# Patient Record
Sex: Female | Born: 1975 | Race: Black or African American | Hispanic: No | Marital: Single | Smoking: Never smoker
Health system: Southern US, Community
[De-identification: ages and names within clinical notes are randomized; demographics above are authoritative.]

---

## 2019-01-21 ENCOUNTER — Other Ambulatory Visit: Payer: Self-pay

## 2019-01-21 ENCOUNTER — Emergency Department (HOSPITAL_COMMUNITY)
Admission: EM | Admit: 2019-01-21 | Discharge: 2019-01-21 | Disposition: A | Discharge status: Home / Self Care | Payer: Self-pay | Attending: Emergency Medicine | Admitting: Emergency Medicine

## 2019-01-21 ENCOUNTER — Encounter (HOSPITAL_COMMUNITY): Payer: Self-pay

## 2019-01-21 DIAGNOSIS — R197 Diarrhea, unspecified: Secondary | ICD-10-CM | POA: Insufficient documentation

## 2019-01-21 DIAGNOSIS — R109 Unspecified abdominal pain: Secondary | ICD-10-CM | POA: Insufficient documentation

## 2019-01-21 DIAGNOSIS — R112 Nausea with vomiting, unspecified: Secondary | ICD-10-CM | POA: Insufficient documentation

## 2019-01-21 MED ORDER — DIPHENHYDRAMINE HCL 50 MG/ML IJ SOLN
12.5000 mg | Freq: Once | INTRAMUSCULAR | Status: AC
  Administered 2019-01-21: 12.5 mg via INTRAVENOUS
  Filled 2019-01-21: qty 1

## 2019-01-21 MED ORDER — METOCLOPRAMIDE HCL 5 MG/ML IJ SOLN
10.0000 mg | Freq: Once | INTRAMUSCULAR | Status: AC
  Administered 2019-01-21: 10 mg via INTRAVENOUS
  Filled 2019-01-21: qty 2

## 2019-01-21 MED ORDER — METOCLOPRAMIDE HCL 10 MG PO TABS: 10.0000 mg | ORAL_TABLET | Freq: Four times a day (QID) | ORAL | 0 refills | Status: AC

## 2019-01-21 MED ORDER — PROMETHAZINE HCL 25 MG RE SUPP: 25.0000 mg | Freq: Four times a day (QID) | RECTAL | 0 refills | Status: AC | PRN

## 2019-01-21 MED ORDER — SODIUM CHLORIDE 0.9 % IV BOLUS
1000.0000 mL | Freq: Once | INTRAVENOUS | Status: AC
  Administered 2019-01-21: 1000 mL via INTRAVENOUS

## 2019-01-21 NOTE — Discharge Instructions (Signed)
Use Reglan tablets as directed for nausea and vomiting. If not able to take oral medication, use the Phenergan suppositories to control nausea/vomiting. REturn to the ED if you develop any fever, severe pain, have uncontrolled vomiting or new concern.

## 2019-01-21 NOTE — ED Notes (Signed)
Offered ODT zofran, she states that she tried it yesterday without relief.

## 2019-01-21 NOTE — ED Provider Notes (Signed)
[facility]-EMERGENCY DEPT Provider Note   CSN: [id number] Arrival date & time: 01/21/19  0125     History   Chief Complaint Chief Complaint  Patient presents with  . Emesis    HPI Lindsay Salinas is a 43 y.o. female.  Patient to the ED with nausea and vomiting x 4 days. No fever. She had diarrhea on day one but none since. She reports emesis with any attempt at PO solids/liquids. No hematemesis. She has abdominal soreness when she vomits but otherwise does not complain of abdominal pain. No urinary symptoms.  Symptoms started after eating at [facility] and she feels this may be related.   The history is provided by the patient. No language interpreter was used.  Emesis  Associated symptoms: abdominal pain (See HPi.)   Associated symptoms: no chills, no cough, no fever and no myalgias     History reviewed. No pertinent past medical history.  There are no active problems to display for this patient.   OB History   No obstetric history on file.      Home Medications    Prior to Admission medications   Not on File    Family History History reviewed. No pertinent family history.  Social History Social History   Tobacco Use  . Smoking status: Not on file  Substance Use Topics  . Alcohol use: Not on file  . Drug use: Not on file     Allergies   Patient has no known allergies.   Review of Systems Review of Systems  Constitutional: Negative for chills and fever.  Respiratory: Negative.  Negative for cough and shortness of breath.   Cardiovascular: Negative.  Negative for chest pain.  Gastrointestinal: Positive for abdominal pain (See HPi.), nausea and vomiting.  Genitourinary: Negative.   Musculoskeletal: Negative.  Negative for myalgias.  Skin: Negative.   Neurological: Negative.  Negative for syncope and weakness.     Physical Exam Updated Vital Signs BP (!) 150/94 (BP Location: Left Arm)   Pulse (!) 56   Temp 98.7 F (37.1 C)  (Oral)   Resp 16   SpO2 100%   Physical Exam Vitals signs and nursing note reviewed.  Constitutional:      General: She is not in acute distress.    Appearance: She is well-developed.  HENT:     Head: Normocephalic.     Mouth/Throat:     Mouth: Mucous membranes are dry.  Neck:     Musculoskeletal: Normal range of motion and neck supple.  Cardiovascular:     Rate and Rhythm: Normal rate and regular rhythm.     Heart sounds: No murmur.  Pulmonary:     Effort: Pulmonary effort is normal.     Breath sounds: Normal breath sounds. No wheezing, rhonchi or rales.  Abdominal:     General: Bowel sounds are normal. There is no distension.     Palpations: Abdomen is soft.     Tenderness: There is no abdominal tenderness. There is no guarding or rebound.  Musculoskeletal: Normal range of motion.  Skin:    General: Skin is warm and dry.     Findings: No rash.     Comments: Good skin turgor.  Neurological:     Mental Status: She is alert and oriented to person, place, and time.      ED Treatments / Results  Labs (all labs ordered are listed, but only abnormal results are displayed) Labs Reviewed - No data to display  EKG  None  Radiology No results found.  Procedures Procedures (including critical care time)  Medications Ordered in ED Medications - No data to display   Initial Impression / Assessment and Plan / ED Course  I have reviewed the triage vital signs and the nursing notes.  Pertinent labs & imaging results that were available during my care of the patient were reviewed by me and considered in my medical decision making (see chart for details).     Patient to ED with 4 days of nausea and vomiting, unable to tolerate any PO. No fever, significant pain or ongoing diarrhea.   She is well appearing, nontoxic. Abdominal exam is benign. She reports nausea.   IV fluids provided with Reglan/Benadryl. Will try PO challenge after.   Patient has received IV fluids.  She feels better. She is tolerating small amounts of PO fluids without further vomiting. She is felt appropriate for discharge home. Return precautions discussed.   Final Clinical Impressions(s) / ED Diagnoses   Final diagnoses:  None   1. Emesis   ED Discharge Orders    None       Elpidio AnisUpstill, Eleuterio Dollar, PA-C 01/21/19 0546    Sabas SousBero, Michael M, MD 01/21/19 [phone]

## 2019-01-21 NOTE — ED Triage Notes (Signed)
Pt reports that she ate a sandwich from someone on Monday morning and had vomiting and diarrhea that day. She reports that she has been experiencing N/V since and is unable to keep anything down.

## 2019-06-06 ENCOUNTER — Other Ambulatory Visit: Payer: Self-pay | Admitting: Family Medicine

## 2019-06-06 DIAGNOSIS — N6310 Unspecified lump in the right breast, unspecified quadrant: Secondary | ICD-10-CM

## 2019-06-07 ENCOUNTER — Other Ambulatory Visit: Payer: Self-pay | Admitting: Family Medicine

## 2019-06-07 ENCOUNTER — Other Ambulatory Visit: Payer: Self-pay

## 2019-06-07 ENCOUNTER — Ambulatory Visit
Admission: RE | Admit: 2019-06-07 | Discharge: 2019-06-07 | Disposition: A | Discharge status: Home / Self Care | Payer: 59 | Source: Ambulatory Visit | Attending: Family Medicine | Admitting: Family Medicine

## 2019-06-07 DIAGNOSIS — N6310 Unspecified lump in the right breast, unspecified quadrant: Secondary | ICD-10-CM

## 2019-06-20 ENCOUNTER — Inpatient Hospital Stay: Admission: RE | Admit: 2019-06-20 | Payer: 59 | Source: Ambulatory Visit

## 2021-01-09 IMAGING — MG DIGITAL DIAGNOSTIC BILATERAL MAMMOGRAM WITH TOMO AND CAD
6 of 10 series · 6 of 30 positions shown · non-contrast
Comparison: None

CLINICAL DATA: This patient has had intermittent right breast
infections in the retroareolar region which present with
retroareolar swelling and periareolar erythema. She has had
intermittent purulence drainage. Currently, she has retroareolar
masslike fullness with surrounding erythema and has recently been
able to express approximately 1 tablespoon of purulence drainage.

EXAM:
DIGITAL DIAGNOSTIC BILATERAL MAMMOGRAM WITH CAD AND TOMO
ULTRASOUND RIGHT BREAST

[L CC synth-2D]
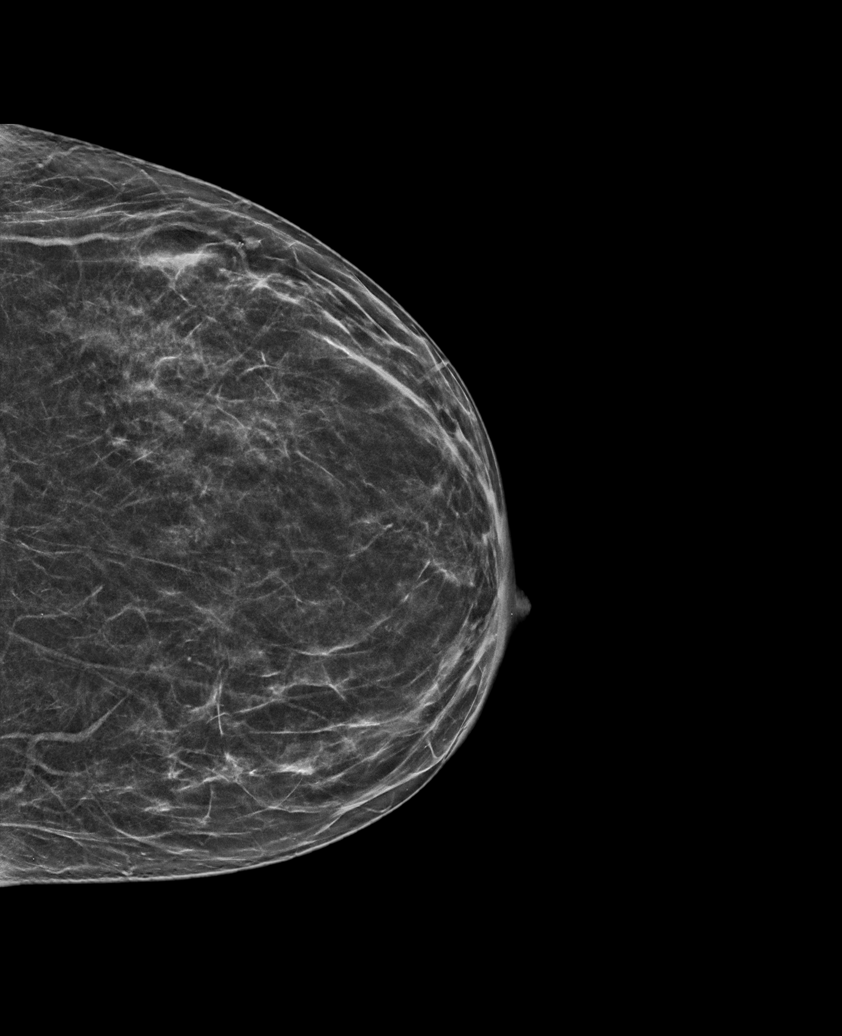

[R TAN synth-2D]
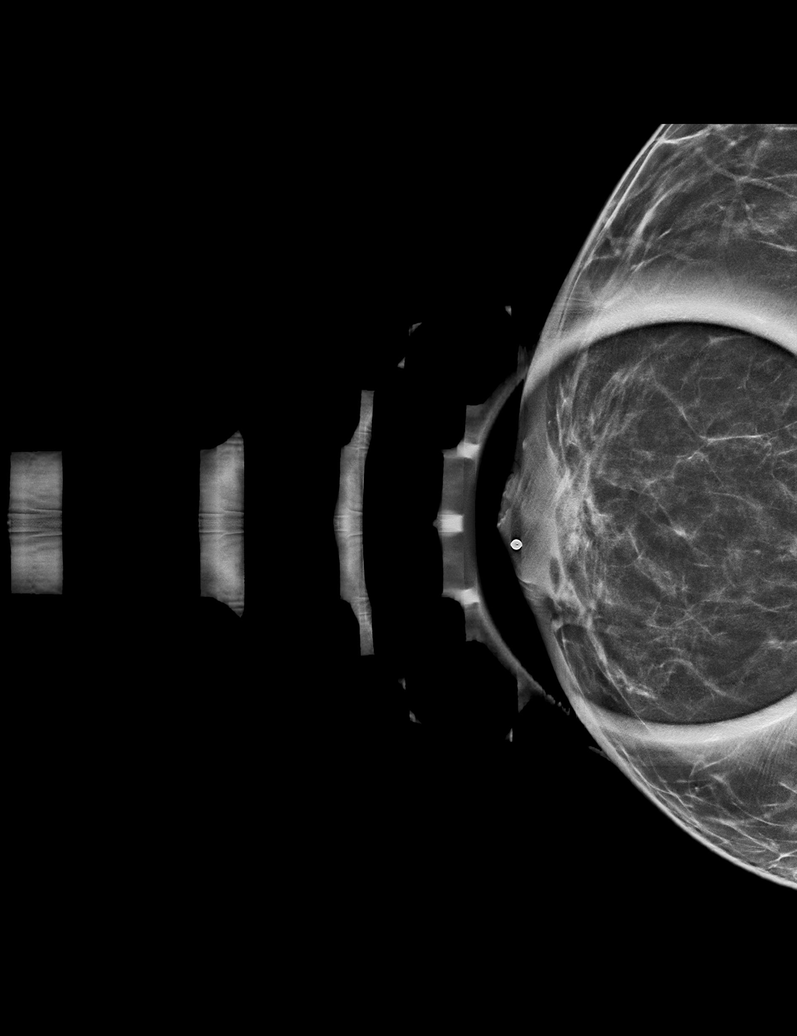

[L MLO synth-2D]
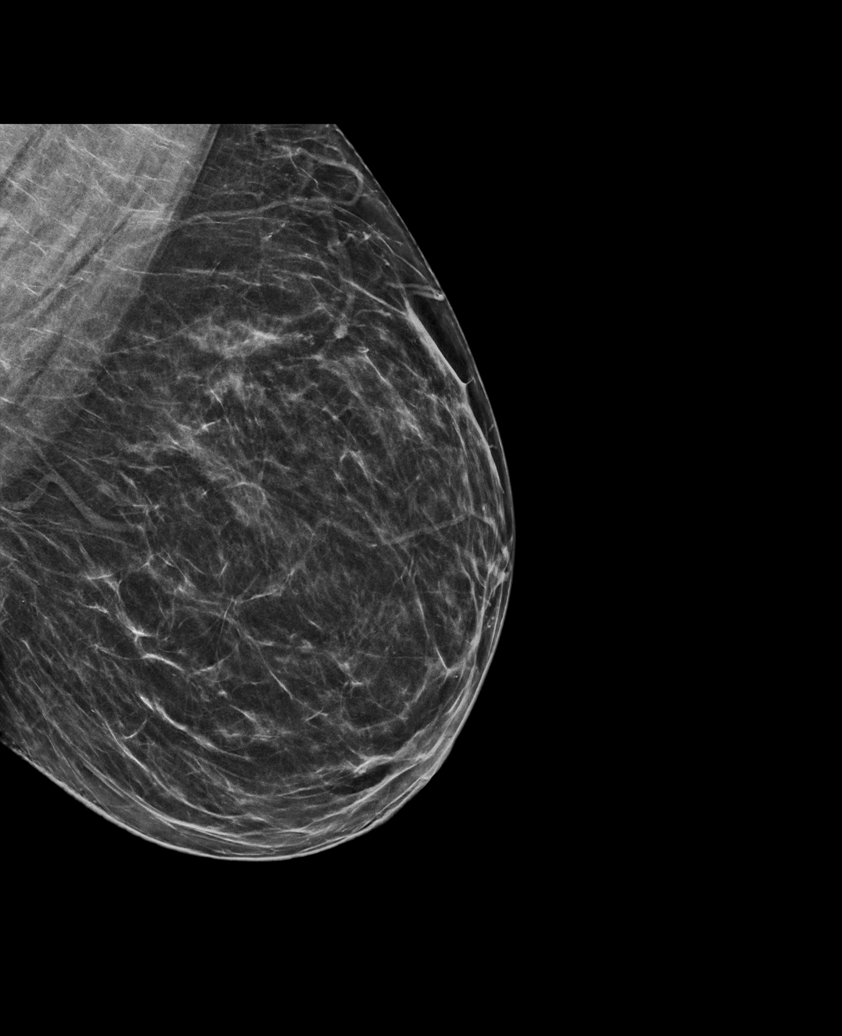

[R MLO synth-2D]
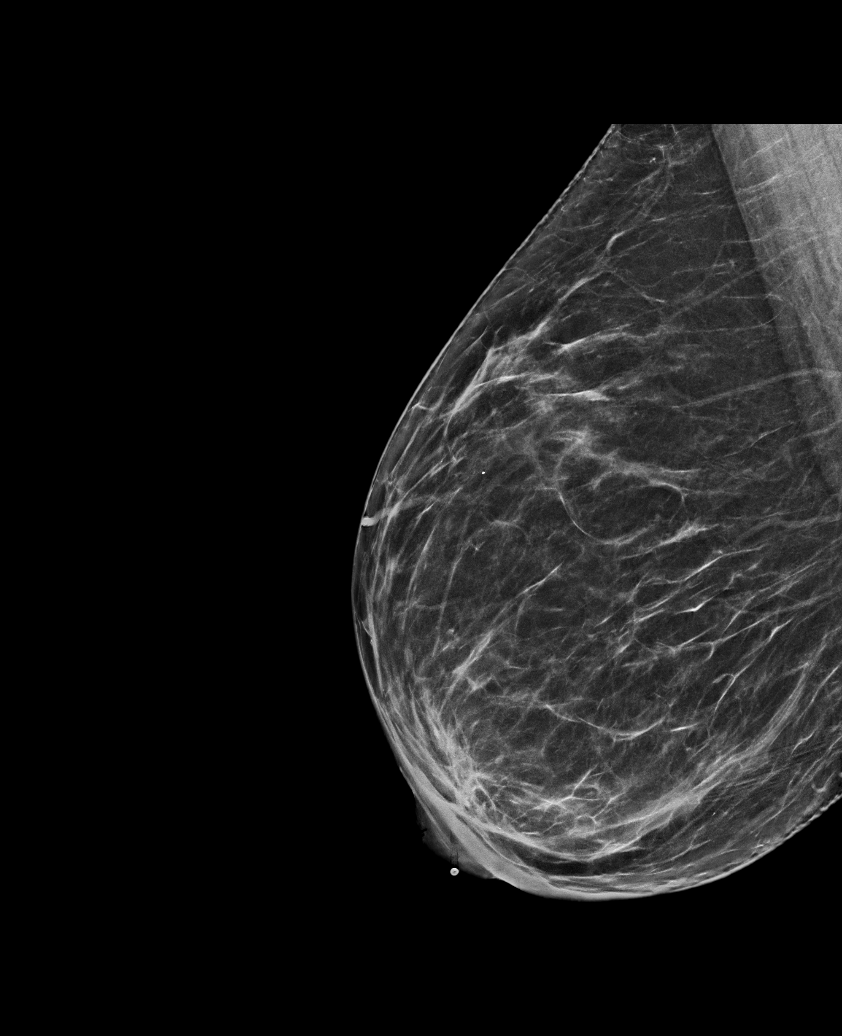

[R CC synth-2D]
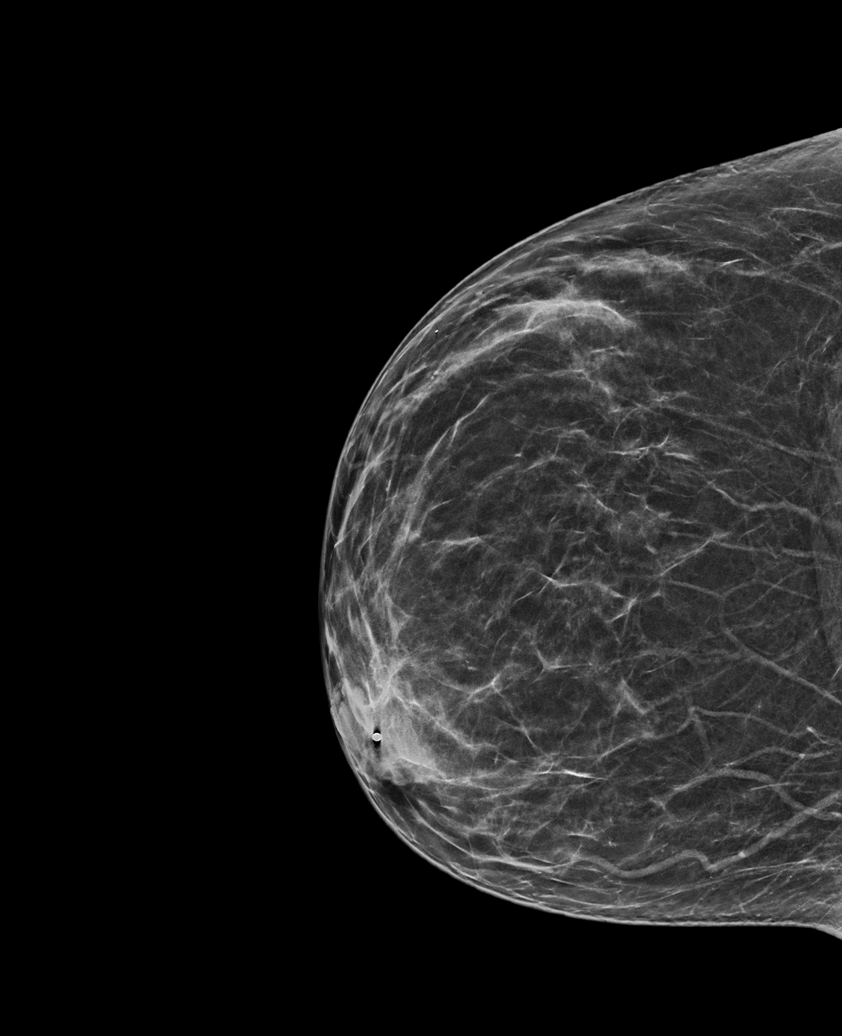

[R MLO tomo · tomo slice 37/73.0]
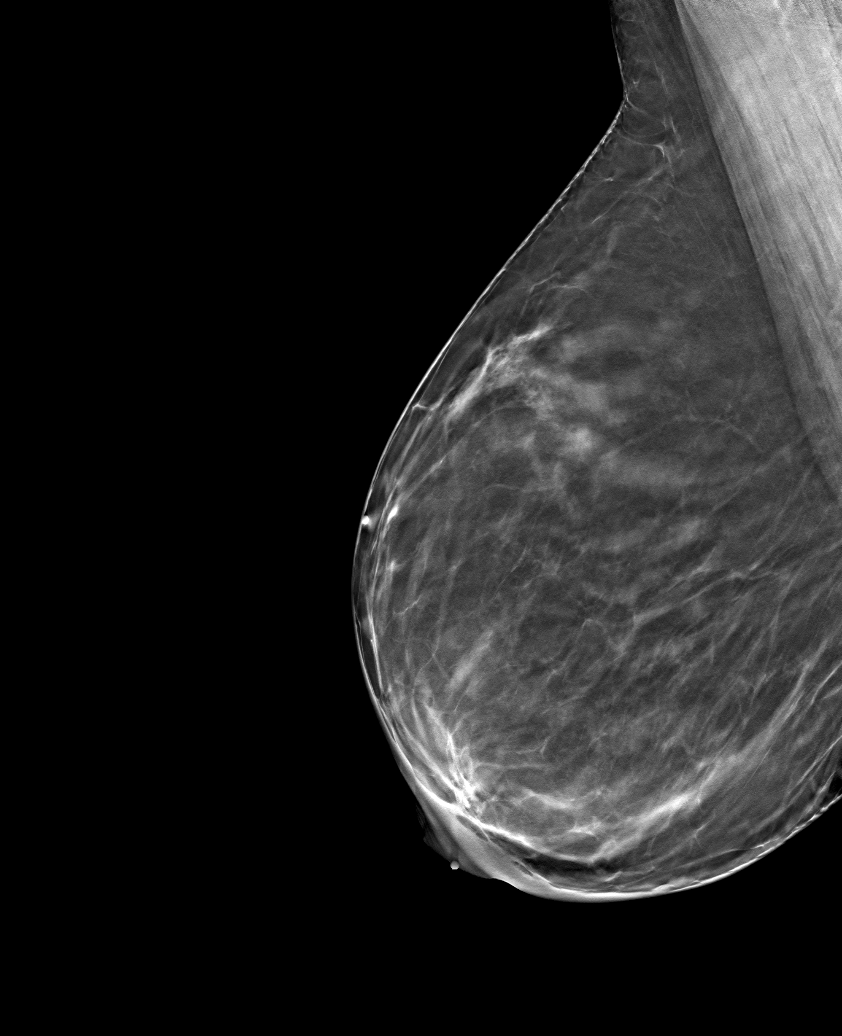

[6 of 30 positions shown; findings below may reference images not displayed]

ACR Breast Density Category b: There are scattered areas of
fibroglandular density.
FINDINGS: There is increased density in the retroareolar region of the right
breast with overlying skin thickening, but without a defined mass.

There are no discrete masses, other areas of asymmetric opacity,
areas of architectural distortion or suspicious calcifications.

Mammographic images were processed with CAD.

On physical exam, there is erythema surrounding the right nipple
with masslike firmness in the retroareolar region of the right
breast.

Targeted ultrasound is performed, showing a heterogeneous, mostly
hypoechoic masslike abnormality in the immediate retroareolar region
of the right breast, with increased blood flow around its margins.
There is a tract of complicated fluid that extends from this to the
nipple, measuring approximately 3 cm in length. There is a small
focus of heterogeneous fluid with in this area that measures 6-7 mm
in greatest dimension. The overall size of the abnormality is
approximately 4.2 x 1.4 x 4.0 cm.
IMPRESSION: 1. Retroareolar right breast infection minimal discrete fluid,
primarily inflamed/infected tissue. Given the location of the
patient's history, this is suspicious for MRSA infection. At this
time, there does not appear to be significant fluid for aspiration.

RECOMMENDATION:
1. Patient we placed on a 14 day course of Bactrim, double-strength,
1 p.o. b.i.d. She is scheduled for repeat ultrasound assessment in
14 days to evaluate for improvement/resolution, and was instructed
to return earlier if symptoms worsen.

I have discussed the findings and recommendations with the patient.
Results were also provided in writing at the conclusion of the
visit. If applicable, a reminder letter will be sent to the patient
regarding the next appointment.

BI-RADS CATEGORY  2: Benign.

## 2021-11-20 ENCOUNTER — Encounter (HOSPITAL_COMMUNITY): Payer: Self-pay | Admitting: Oncology

## 2021-11-20 ENCOUNTER — Emergency Department (HOSPITAL_COMMUNITY)
Admission: EM | Admit: 2021-11-20 | Discharge: 2021-11-20 | Disposition: A | Discharge status: Home / Self Care | Payer: 59 | Attending: Emergency Medicine | Admitting: Emergency Medicine

## 2021-11-20 ENCOUNTER — Emergency Department (HOSPITAL_COMMUNITY): Payer: 59

## 2021-11-20 ENCOUNTER — Other Ambulatory Visit: Payer: Self-pay

## 2021-11-20 DIAGNOSIS — T148XXA Other injury of unspecified body region, initial encounter: Secondary | ICD-10-CM

## 2021-11-20 DIAGNOSIS — S92411A Displaced fracture of proximal phalanx of right great toe, initial encounter for closed fracture: Secondary | ICD-10-CM | POA: Insufficient documentation

## 2021-11-20 DIAGNOSIS — S99921A Unspecified injury of right foot, initial encounter: Secondary | ICD-10-CM | POA: Diagnosis present

## 2021-11-20 DIAGNOSIS — M79674 Pain in right toe(s): Secondary | ICD-10-CM | POA: Insufficient documentation

## 2021-11-20 DIAGNOSIS — Y92410 Unspecified street and highway as the place of occurrence of the external cause: Secondary | ICD-10-CM | POA: Insufficient documentation

## 2021-11-20 MED ORDER — HYDROCODONE-ACETAMINOPHEN 5-325 MG PO TABS: 1.0000 | ORAL_TABLET | Freq: Four times a day (QID) | ORAL | 0 refills | Status: AC | PRN

## 2021-11-20 MED ORDER — ACETAMINOPHEN 325 MG PO TABS
650.0000 mg | ORAL_TABLET | Freq: Once | ORAL | Status: AC
  Administered 2021-11-20: 650 mg via ORAL
  Filled 2021-11-20: qty 2
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
## 2021-11-20 NOTE — Discharge Instructions (Addendum)
Your x-ray showed avulsion fracture of your right big toe.  It is important that you call the orthopedist and inform them that you were seen in the ED to set up a follow-up appointment.  Wear the post op shoe during the day, you may weight bear as you can tolerate. You may remove the post op shoe at night.  You may continue with buddy taping the two toes as needed for pain. You are prescribed Norco take as prescribed.  Do not operate any heavy machinery or drive while taking this medication. You may apply ice to the affected area for up to 15 minutes at a time.  Ensure to place a barrier between your skin and the ice.  Return to the Emergency Department if you are experiencing increasing/worsening swelling, bruising, pain, or worsening symptoms.

## 2021-11-20 NOTE — ED Notes (Signed)
Pt in room speaking to registration before leaving.

## 2021-11-20 NOTE — ED Notes (Signed)
Ortho tech bedside 
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
## 2021-11-20 NOTE — ED Notes (Signed)
An After Visit Summary was printed and given to the patient. Discharge instructions given and no further questions at this time.  

## 2022-05-09 ENCOUNTER — Telehealth: Payer: Self-pay | Admitting: Urgent Care

## 2022-05-09 ENCOUNTER — Telehealth: Payer: Self-pay

## 2022-05-09 ENCOUNTER — Ambulatory Visit
Admission: EM | Admit: 2022-05-09 | Discharge: 2022-05-09 | Disposition: A | Discharge status: Home / Self Care | Payer: 59

## 2022-05-09 DIAGNOSIS — N611 Abscess of the breast and nipple: Secondary | ICD-10-CM | POA: Diagnosis not present

## 2022-05-09 DIAGNOSIS — N644 Mastodynia: Secondary | ICD-10-CM | POA: Diagnosis not present

## 2022-05-09 MED ORDER — MELOXICAM 15 MG PO TABS: 15.0000 mg | ORAL_TABLET | Freq: Every day | ORAL | 1 refills | Status: AC

## 2022-05-09 MED ORDER — FLUCONAZOLE 150 MG PO TABS: 150.0000 mg | ORAL_TABLET | ORAL | 0 refills | Status: AC

## 2022-05-09 MED ORDER — NAPROXEN 500 MG PO TABS: 500.0000 mg | ORAL_TABLET | Freq: Two times a day (BID) | ORAL | 0 refills | Status: DC

## 2022-05-09 MED ORDER — CLINDAMYCIN HCL 300 MG PO CAPS: 300.0000 mg | ORAL_CAPSULE | Freq: Three times a day (TID) | ORAL | 0 refills | Status: AC

## 2022-05-09 NOTE — Discharge Instructions (Signed)
Please change your dressing 3-5 times daily. Do not apply any ointments or creams. Each time you change your dressing, make sure that you are pressing on the wound to get pus to come out.  Try your best to have a family member help you clean gently around the perimeter of the wound with gentle soap and warm water. Pat your wound dry and let it air out if possible to make sure it is dry before reapplying another dressing.   Start clindamycin for the infection. Use naproxen for the pain.  Follow-up with [facility] to request a consultation on having a procedure to get your abscess treated.

## 2022-05-09 NOTE — Telephone Encounter (Signed)
Patient called urgent care requesting another medication for pain management. Provider sent in a medication and the patient has been made aware and educated on it.

## 2022-05-09 NOTE — Telephone Encounter (Signed)
Will send a script for meloxicam to her pharmacy to help with her pain.

## 2022-05-09 NOTE — ED Triage Notes (Signed)
Pt c/o boil/ abscess that drains. She states there is drainage above her right nipple but a lump is under the nipple.  There pin hole opening above the top of the patients right nipple with no draining at this time.

## 2022-05-09 NOTE — ED Provider Notes (Addendum)
[place]   MRN: [id number] DOB: 1976-11-10  Subjective:   Lindsay Salinas is a 46 y.o. female presenting for several week history of recurrent drainage just above the nipple of the right breast.  Patient reports that she has previously had an abscess of the area.  This happened about 3 years ago.  States that she had an allergic reaction to the Bactrim that she was placed on and therefore stopped taking it and never followed up.  She is also had a reaction to doxycycline which she was put on for acne.  States that she can remember exactly what happened with it.  Denies fever, nausea, vomiting, body aches, chills.  No current facility-administered medications for this encounter.  Current Outpatient Medications:    cetirizine (ZYRTEC) 10 MG chewable tablet, Chew 10 mg by mouth daily., Disp: , Rfl:    cetirizine (ZYRTEC) 10 MG tablet, Take 10 mg by mouth daily., Disp: , Rfl:    HYDROcodone-acetaminophen (NORCO/VICODIN) 5-325 MG tablet, Take 1 tablet by mouth every 6 (six) hours as needed., Disp: 10 tablet, Rfl: 0   metoCLOPramide (REGLAN) 10 MG tablet, Take 1 tablet (10 mg total) by mouth every 6 (six) hours., Disp: 15 tablet, Rfl: 0   promethazine (PHENERGAN) 25 MG suppository, Place 1 suppository (25 mg total) rectally every 6 (six) hours as needed for nausea or vomiting., Disp: 6 each, Rfl: 0   Allergies  Allergen Reactions   Penicillins Anaphylaxis    Patient states she has swelling and itching to her lips.    Doxycycline     Patient states she felt tingling in her lips while taking this medication.    History reviewed. No pertinent past medical history.   History reviewed. No pertinent surgical history.  Family History  Adopted: Yes    Social History   Tobacco Use   Smoking status: Never    Passive exposure: Never   Smokeless tobacco: Never  Vaping Use   Vaping Use: Every day   Substances: Nicotine, Flavoring  Substance Use Topics    Alcohol use: Not Currently   Drug use: Not Currently    ROS   Objective:   Vitals: BP 126/69 (BP Location: Left Arm)   Pulse 65   Temp 98.6 F (37 C) (Oral)   Resp 18   LMP 04/09/2022   SpO2 98%   Physical Exam Constitutional:      General: She is not in acute distress.    Appearance: Normal appearance. She is well-developed. She is not ill-appearing, toxic-appearing or diaphoretic.  HENT:     Head: Normocephalic and atraumatic.     Nose: Nose normal.     Mouth/Throat:     Mouth: Mucous membranes are moist.  Eyes:     General: No scleral icterus.       Right eye: No discharge.        Left eye: No discharge.     Extraocular Movements: Extraocular movements intact.  Cardiovascular:     Rate and Rhythm: Normal rate and regular rhythm.     Heart sounds: Normal heart sounds. No murmur heard.   No friction rub. No gallop.  Pulmonary:     Effort: Pulmonary effort is normal. No respiratory distress.     Breath sounds: No stridor. No wheezing, rhonchi or rales.  Chest:     Chest wall: No tenderness.  Breasts:    Right: Swelling, mass and tenderness present. No bleeding, inverted nipple or nipple discharge.  Comments: RN Tiffany assisted as chaperone. Skin:    General: Skin is warm and dry.  Neurological:     General: No focal deficit present.     Mental Status: She is alert and oriented to person, place, and time.  Psychiatric:        Mood and Affect: Mood normal.        Behavior: Behavior normal.    IMPRESSION: 1. Retroareolar right breast infection minimal discrete fluid, primarily inflamed/infected tissue. Given the location of the patient's history, this is suspicious for MRSA infection. At this time, there does not appear to be significant fluid for aspiration.   RECOMMENDATION: 1. Patient we placed on a 14 day course of Bactrim, double-strength, 1 p.o. b.i.d. She is scheduled for repeat ultrasound assessment in 14 days to evaluate for  improvement/resolution, and was instructed to return earlier if symptoms worsen.   I have discussed the findings and recommendations with the patient. Results were also provided in writing at the conclusion of the visit. If applicable, a reminder letter will be sent to the patient regarding the next appointment.   BI-RADS CATEGORY  2: Benign.     Electronically Signed   By: Lajean Manes M.D.   On: 06/07/2019 15:09   Aspiration procedure: Right breast Verbal consent obtained.  Area cleansed with Betadine and alcohol.  Topical numbing performed thereafter local anesthesia with 2% plus epinephrine ~2cc.  An 18-gauge needle was inserted at the 3 o'clock position of the nipple, 1/2cc of purulence aspirated.  Wound cleansed and dressed.  Assessment and Plan :   PDMP not reviewed this encounter.  1. Breast abscess   2. Breast pain, right    Unfortunately we do not have the ability to perform anaerobic cultures.  Patient has a recurrent abscess and would benefit from procedural incision and drainage.  Therefore I recommended that she contact [facility] for this.  In the meantime start clindamycin and use fluconazole for yeast infections associated with antibiotic use.  Counseled on wound care.  Apply warm compresses at home. Counseled patient on potential for adverse effects with medications prescribed/recommended today, ER and return-to-clinic precautions discussed, patient verbalized understanding.    Jaynee Eagles, PA-C 05/09/22 1124
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
## 2024-02-13 ENCOUNTER — Emergency Department (HOSPITAL_COMMUNITY)
Admission: EM | Admit: 2024-02-13 | Discharge: 2024-02-13 | Disposition: A | Discharge status: Home / Self Care | Attending: Emergency Medicine | Admitting: Emergency Medicine

## 2024-02-13 DIAGNOSIS — B349 Viral infection, unspecified: Secondary | ICD-10-CM | POA: Insufficient documentation

## 2024-02-13 DIAGNOSIS — R519 Headache, unspecified: Secondary | ICD-10-CM | POA: Diagnosis not present

## 2024-02-13 DIAGNOSIS — R112 Nausea with vomiting, unspecified: Secondary | ICD-10-CM

## 2024-02-13 DIAGNOSIS — R197 Diarrhea, unspecified: Secondary | ICD-10-CM | POA: Insufficient documentation

## 2024-02-13 LAB — COMPREHENSIVE METABOLIC PANEL
ALT: 16 U/L (ref 0–44)
AST: 16 U/L (ref 15–41)
Albumin: 4.2 g/dL (ref 3.5–5.0)
Alkaline Phosphatase: 70 U/L (ref 38–126)
Anion gap: 12 (ref 5–15)
BUN: 12 mg/dL (ref 6–20)
CO2: 22 mmol/L (ref 22–32)
Calcium: 9.3 mg/dL (ref 8.9–10.3)
Chloride: 104 mmol/L (ref 98–111)
Creatinine, Ser: 0.53 mg/dL (ref 0.44–1.00)
GFR, Estimated: >60 mL/min (ref >60)
Glucose, Bld: 147 mg/dL — ABNORMAL HIGH (ref 70–99)
Potassium: 3.9 mmol/L (ref 3.5–5.1)
Sodium: 138 mmol/L (ref 135–145)
Total Bilirubin: 0.7 mg/dL (ref 0.0–1.2)
Total Protein: 7.8 g/dL (ref 6.5–8.1)

## 2024-02-13 LAB — URINALYSIS, ROUTINE W REFLEX MICROSCOPIC
Bilirubin Urine: NEGATIVE
Glucose, UA: NEGATIVE mg/dL
Hgb urine dipstick: NEGATIVE
Ketones, ur: NEGATIVE mg/dL
Leukocytes,Ua: NEGATIVE
Nitrite: NEGATIVE
Protein, ur: NEGATIVE mg/dL
Specific Gravity, Urine: 1.012 (ref 1.005–1.030)
pH: 8 (ref 5.0–8.0)

## 2024-02-13 LAB — CBC
HCT: 39.9 % (ref 36.0–46.0)
Hemoglobin: 12.7 g/dL (ref 12.0–15.0)
MCH: 27.7 pg (ref 26.0–34.0)
MCHC: 31.8 g/dL (ref 30.0–36.0)
MCV: 86.9 fL (ref 80.0–100.0)
Platelets: 403 10*3/uL — ABNORMAL HIGH (ref 150–400)
RBC: 4.59 MIL/uL (ref 3.87–5.11)
RDW: 14.7 % (ref 11.5–15.5)
WBC: 7.9 10*3/uL (ref 4.0–10.5)
nRBC: 0 % (ref 0.0–0.2)

## 2024-02-13 LAB — HCG, SERUM, QUALITATIVE: Preg, Serum: NEGATIVE

## 2024-02-13 LAB — LIPASE, BLOOD: Lipase: 26 U/L (ref 11–51)

## 2024-02-13 MED ORDER — ONDANSETRON 8 MG PO TBDP: 8.0000 mg | ORAL_TABLET | Freq: Three times a day (TID) | ORAL | 0 refills | Status: AC | PRN

## 2024-02-13 MED ORDER — DIPHENHYDRAMINE HCL 50 MG/ML IJ SOLN
25.0000 mg | Freq: Once | INTRAMUSCULAR | Status: AC
  Administered 2024-02-13: 25 mg via INTRAVENOUS
  Filled 2024-02-13: qty 1

## 2024-02-13 MED ORDER — IBUPROFEN 600 MG PO TABS: 600.0000 mg | ORAL_TABLET | Freq: Four times a day (QID) | ORAL | 0 refills | Status: AC | PRN

## 2024-02-13 MED ORDER — ONDANSETRON 4 MG PO TBDP
4.0000 mg | ORAL_TABLET | Freq: Once | ORAL | Status: AC | PRN
  Administered 2024-02-13: 4 mg via ORAL
  Filled 2024-02-13: qty 1

## 2024-02-13 MED ORDER — PROCHLORPERAZINE EDISYLATE 10 MG/2ML IJ SOLN
10.0000 mg | Freq: Once | INTRAMUSCULAR | Status: AC
  Administered 2024-02-13: 10 mg via INTRAVENOUS
  Filled 2024-02-13: qty 2

## 2024-02-13 MED ORDER — LACTATED RINGERS IV BOLUS
500.0000 mL | Freq: Once | INTRAVENOUS | Status: AC
  Administered 2024-02-13: 500 mL via INTRAVENOUS

## 2024-02-13 MED ORDER — ONDANSETRON HCL 4 MG/2ML IJ SOLN: 4.0000 mg | Freq: Once | INTRAMUSCULAR | Status: DC

## 2024-02-13 NOTE — ED Provider Notes (Signed)
 [place] EMERGENCY DEPARTMENT AT [facility] Provider Note   CSN: [id number] Arrival date & time: 02/13/24  4098     History  Chief Complaint  Patient presents with   Emesis    Lindsay Salinas is a 48 y.o. female.  HPI    48 year old female with no significant past medical history comes in with chief complaint of vomiting, diarrhea.  Patient states that she has been ill for the last several days.  She recently was started on azithromycin and Tessalon by telehealth doctor.  Last night however she started having some nausea, vomiting and loose bowel movements.  She had emesis more than 10 times, now bilious.  Patient has had about 2-3 loose bowel movements.  She has some cramping abdominal pain, that gets better after vomiting or diarrhea.  She has no history of abdominal surgeries.  Review of system is negative for any fevers, UTI-like symptoms, vaginal discharge.  Patient does not think she is pregnant.  Home Medications Prior to Admission medications   Medication Sig Start Date End Date Taking? Authorizing Provider  ibuprofen (ADVIL) 600 MG tablet Take 1 tablet (600 mg total) by mouth every 6 (six) hours as needed for headache. 02/13/24  Yes Antonyo Hinderer, MD  ondansetron (ZOFRAN-ODT) 8 MG disintegrating tablet Take 1 tablet (8 mg total) by mouth every 8 (eight) hours as needed for nausea. 02/13/24  Yes Derwood Kaplan, MD  cetirizine (ZYRTEC) 10 MG chewable tablet Chew 10 mg by mouth daily.    [provider]  cetirizine (ZYRTEC) 10 MG tablet Take 10 mg by mouth daily.    [provider]  fluconazole (DIFLUCAN) 150 MG tablet Take 1 tablet (150 mg total) by mouth once a week. 05/09/22   Wallis Bamberg, PA-C  HYDROcodone-acetaminophen (NORCO/VICODIN) 5-325 MG tablet Take 1 tablet by mouth every 6 (six) hours as needed. 11/20/21   Blue, Soijett A, PA-C  meloxicam (MOBIC) 15 MG tablet Take 1 tablet (15 mg total) by mouth daily. 05/09/22   Wallis Bamberg, PA-C   metoCLOPramide (REGLAN) 10 MG tablet Take 1 tablet (10 mg total) by mouth every 6 (six) hours. 01/21/19   Elpidio Anis, PA-C  promethazine (PHENERGAN) 25 MG suppository Place 1 suppository (25 mg total) rectally every 6 (six) hours as needed for nausea or vomiting. 01/21/19   Elpidio Anis, PA-C      Allergies    Penicillins and Bactrim [sulfamethoxazole-trimethoprim]    Review of Systems   Review of Systems  All other systems reviewed and are negative.   Physical Exam Updated Vital Signs BP (!) 140/69   Pulse (!) 58   Temp 97.8 F (36.6 C) (Oral)   Resp 18   Ht 5\' 8"  (1.727 m)   Wt 72.6 kg   SpO2 100%   BMI 24.33 kg/m  Physical Exam Vitals and nursing note reviewed.  Constitutional:      Appearance: She is well-developed.  HENT:     Head: Atraumatic.  Cardiovascular:     Rate and Rhythm: Normal rate.  Pulmonary:     Effort: Pulmonary effort is normal.  Abdominal:     Palpations: Abdomen is soft.     Tenderness: There is no abdominal tenderness.  Musculoskeletal:     Cervical back: Normal range of motion and neck supple.  Skin:    General: Skin is warm and dry.  Neurological:     Mental Status: She is alert and oriented to person, place, and time.  ED Results / Procedures / Treatments   Labs (all labs ordered are listed, but only abnormal results are displayed) Labs Reviewed  COMPREHENSIVE METABOLIC PANEL - Abnormal; Notable for the following components:      Result Value   Glucose, Bld 147 (*)    All other components within normal limits  CBC - Abnormal; Notable for the following components:   Platelets 403 (*)    All other components within normal limits  URINALYSIS, ROUTINE W REFLEX MICROSCOPIC - Abnormal; Notable for the following components:   Color, Urine STRAW (*)    All other components within normal limits  LIPASE, BLOOD  HCG, SERUM, QUALITATIVE    EKG None  Radiology No results found.  Procedures Procedures    Medications Ordered  in ED Medications  ondansetron (ZOFRAN-ODT) disintegrating tablet 4 mg (4 mg Oral Given 02/13/24 1014)  lactated ringers bolus 500 mL (0 mLs Intravenous Stopped 02/13/24 1724)  prochlorperazine (COMPAZINE) injection 10 mg (10 mg Intravenous Given 02/13/24 1608)  diphenhydrAMINE (BENADRYL) injection 25 mg (25 mg Intravenous Given 02/13/24 1608)  lactated ringers bolus 500 mL (500 mLs Intravenous New Bag/Given 02/13/24 1724)    ED Course/ Medical Decision Making/ A&P                                 Medical Decision Making Amount and/or Complexity of Data Reviewed Labs: ordered.  Risk Prescription drug management.   47 year old patient comes in with chief complaint of nausea, vomiting, diarrhea.  Patient has been having some URI-like symptoms and was started on Z-Pak about 3 days ago.  She had tunafish yesterday.  Has been having primarily nausea and vomiting as the main complaint since then.  She has had loose bowel movements and abdominal discomfort.  Pain appears to be coming in waves, and it gets better with emesis.  On my exam, patient has no focal abdominal tenderness and there is no rebound or guarding.  Abdomen is soft.  There is no abdominal surgical history.  Differential diagnosis for this patient includes viral illness leading to gastroenteritis, food toxin mediated process, scombroid food poisoning, severe dehydration.  Patient does not have any rashes, itching which is reassuring.  No fevers.  Patient had basic labs ordered from triage.  Lab workup is overall reassuring.  Patient's vital signs are stable and within normal limits.  No SIRS criteria.  Plan is to focus on symptom management.  No imaging indicated at this time.   5:42 PM Patient reassessed. Patient's headache has resolved.  She is now asleep.  She is not vomiting.  Has tolerated p.o. intake.  Has received 1 L of IV fluid.  Patient is stable for discharge.  Patient's girlfriend is at the bedside.  She is asking that  patient be given something for her migraine headaches.  Patient does not carry a migraine diagnosis.  Patient's headaches are relatively new, has been present for about a month, they are recurrent.  Patient is now resting comfortably and asleep.  I have informed her that we will start patient on Tylenol, but if she starts having recurrence of the headaches, then she should follow-up with PCP.  Patient does not really have a PCP per girlfriend, therefore we will give her phone number for neurology. Return precautions discussed for worsening headache, fevers, acute neurologic changes. Final Clinical Impression(s) / ED Diagnoses Final diagnoses:  Nausea vomiting and diarrhea  Viral illness  Recurrent headache  Rx / DC Orders ED Discharge Orders          Ordered    ondansetron (ZOFRAN-ODT) 8 MG disintegrating tablet  Every 8 hours PRN        02/13/24 1741    ibuprofen (ADVIL) 600 MG tablet  Every 6 hours PRN        02/13/24 1741              Derwood Kaplan, MD 02/13/24 1751

## 2024-02-13 NOTE — Discharge Instructions (Addendum)
 You were seen in the emergency room for GI illness.  It is unclear what is causing the symptoms, could be food toxin mediated process or viral illness.  The management involves taking medications for symptom management. Hydrate well.  Additionally, you also have complained of headaches that have been recurrent for several days now.  If your symptoms do not improve with Tylenol, please consider calling the neurologist.  Please return to the ER if the headache gets severe and in not improving, you have associated new one sided numbness, tingling, weakness or confusion, seizures, poor balance or poor vision.

## 2024-02-13 NOTE — ED Triage Notes (Signed)
 Patient reports had televisit several days ago for cough Given z back and cough meds Yesterday ate tuna finsh Has had vomiting and diarrhea since then Pain in abd 7/10 Denies sick contacts

## 2024-02-15 LAB — CBG MONITORING, ED: Glucose-Capillary: 125 mg/dL — ABNORMAL HIGH (ref 70–99)
